# Patient Record
Sex: Female | Born: 1965 | Race: White | Hispanic: No | Marital: Married | Smoking: Never smoker
Health system: Southern US, Community
[De-identification: ages and names within clinical notes are randomized; demographics above are authoritative.]

---

## 1995-08-31 HISTORY — PX: TUBAL LIGATION: SHX77

## 2011-10-08 DIAGNOSIS — D219 Benign neoplasm of connective and other soft tissue, unspecified: Secondary | ICD-10-CM | POA: Insufficient documentation

## 2013-06-12 HISTORY — PX: ABDOMINAL HYSTERECTOMY: SHX81

## 2013-12-18 ENCOUNTER — Ambulatory Visit (INDEPENDENT_AMBULATORY_CARE_PROVIDER_SITE_OTHER): Payer: PRIVATE HEALTH INSURANCE | Admitting: Family Medicine

## 2013-12-18 ENCOUNTER — Encounter: Payer: Self-pay | Admitting: Family Medicine

## 2013-12-18 VITALS — BP 124/85 | HR 74 | Temp 98.6°F | Ht 60.0 in | Wt 112.0 lb

## 2013-12-18 DIAGNOSIS — Z131 Encounter for screening for diabetes mellitus: Secondary | ICD-10-CM

## 2013-12-18 DIAGNOSIS — B9689 Other specified bacterial agents as the cause of diseases classified elsewhere: Secondary | ICD-10-CM

## 2013-12-18 DIAGNOSIS — J329 Chronic sinusitis, unspecified: Secondary | ICD-10-CM

## 2013-12-18 DIAGNOSIS — A499 Bacterial infection, unspecified: Secondary | ICD-10-CM

## 2013-12-18 DIAGNOSIS — Z13220 Encounter for screening for lipoid disorders: Secondary | ICD-10-CM

## 2013-12-18 DIAGNOSIS — M19049 Primary osteoarthritis, unspecified hand: Secondary | ICD-10-CM

## 2013-12-18 MED ORDER — AMOXICILLIN-POT CLAVULANATE 500-125 MG PO TABS: ORAL_TABLET | ORAL | Status: DC

## 2013-12-18 NOTE — Progress Notes (Signed)
CC: Tracy Payne is a 48 y.o. 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 She has tried ibuprofen without much benefit no other interventions. Symptoms are worse in the evening when lying down.  Patient complains of bilateral hand pain that comes and goes on a daily basis mild in severity seems to be worse in cold weather or at the end of the day if she's been typing or writing throughout the day. She localizes pain to the base of the thumbs and the base of the first digit bilaterally. She does not take any medications for the pain. She denies swelling redness or warmth or anything involved locations. She has a family history of osteoarthritis but no known history of rheumatoid or any other autoimmune arthritis conditions. Denies motor or sensory disturbances in either hand or upper extremity  Review of Systems - General ROS: negative for - chills, fever, night sweats, weight gain or weight loss Ophthalmic ROS: negative for - decreased vision Psychological ROS: negative for - anxiety or depression ENT ROS: negative for - hearing change,  tinnitus or allergies Hematological and Lymphatic ROS: negative for - bleeding problems, bruising or swollen lymph nodes Breast ROS: negative Respiratory ROS: no  shortness of breath, or wheezing Cardiovascular ROS: no chest pain or dyspnea on exertion Gastrointestinal ROS: no abdominal pain, change in bowel habits, or black or bloody stools Genito-Urinary ROS: negative for - genital discharge, genital ulcers, incontinence or abnormal bleeding from genitals Musculoskeletal ROS: negative for - joint pain or muscle pain other  than that described above Neurological ROS: negative for - headaches or memory loss Dermatological ROS: negative for lumps, mole changes, rash and skin lesion changes  History reviewed. No pertinent past medical history.   Family History  Problem Relation Age of Onset  . Cancer      maternal grandfather,paternal granmother  . Heart attack Paternal Grandfather   . Hyperlipidemia Mother   . Hypertension Mother   . Stroke Maternal Grandmother      History  Substance Use Topics  . Smoking status: Never Smoker   . Smokeless tobacco: Not on file  . Alcohol Use: No     Objective: Filed Vitals:   12/18/13 1518  BP: 124/85  Pulse: 74  Temp: 98.6 F (37 C)    General: Alert and Oriented, No Acute Distress HEENT: Pupils equal, round, reactive to light. Conjunctivae clear.  External ears unremarkable, canals clear with intact TMs with appropriate landmarks.  Middle ear appears open without effusion. Erythematous inferior turbinates with mild mucoid discharge.  Moist mucous membranes, pharynx without inflammation nor lesions.  Neck supple without palpable lymphadenopathy nor abnormal masses. Lungs: Clear to auscultation bilaterally, no wheezing/ronchi/rales.  Comfortable work of breathing. Good air movement. Cardiac: Regular rate and rhythm. Normal S1/S2.  No murmurs, rubs, nor gallops.   Extremities: No peripheral edema.  Strong peripheral pulses. Full range of motion strength in bilateral hands Finkelstein's is negative bilaterally no pain in anatomical snuff box bilaterally. There is no swelling redness or warmth of any of the joints in either hand. Mental Status: No depression, anxiety, nor agitation. Skin: Warm and dry.  Assessment & Plan: [name] was seen today  for establish care and sinusitis.  Diagnoses and associated orders for this visit:  Bacterial sinusitis - amoxicillin-clavulanate (AUGMENTIN) 500-125 MG per tablet; Take one by mouth every 8 hours for ten total  days.  Lipid screening - Lipid panel  Diabetes mellitus screening - BASIC METABOLIC PANEL WITH GFR  Osteoarthritis, hand    Bacterial sinusitis: Wednesday will be the seventh day of her symptoms it symptoms persist at that point start Augmentin for now consider Alka-Seltzer cold and sinus and nasal saline washes as needed Osteoarthritis of the hands: Encouraged her to consider starting meloxicam however she believes that taking a medication would be more annoying than dealing with her current level of symptoms, I've encouraged her to return threshold is crossed when she wants to start medication Due for routine dyslipidemia and diabetic screening  Return if symptoms worsen or fail to improve, for CPE at your convienence .

## 2013-12-22 ENCOUNTER — Telehealth: Payer: Self-pay | Admitting: *Deleted

## 2013-12-22 MED ORDER — DOXYCYCLINE HYCLATE 100 MG PO TABS: ORAL_TABLET | ORAL | Status: AC

## 2013-12-22 NOTE — Telephone Encounter (Signed)
Pt notified rx faxed.

## 2013-12-22 NOTE — Telephone Encounter (Signed)
Switch to doxycycline. Tracy Payne, Rx placed in in-box ready for pickup/faxing.

## 2013-12-22 NOTE — Telephone Encounter (Signed)
Pt states the augmentin is causing diarrhea and nausea.

## 2014-01-22 ENCOUNTER — Encounter: Payer: Self-pay | Admitting: Physician Assistant

## 2014-01-22 ENCOUNTER — Ambulatory Visit (INDEPENDENT_AMBULATORY_CARE_PROVIDER_SITE_OTHER): Payer: PRIVATE HEALTH INSURANCE | Admitting: Physician Assistant

## 2014-01-22 VITALS — BP 120/73 | HR 80 | Temp 97.8°F | Wt 111.0 lb

## 2014-01-22 DIAGNOSIS — Z131 Encounter for screening for diabetes mellitus: Secondary | ICD-10-CM

## 2014-01-22 DIAGNOSIS — A499 Bacterial infection, unspecified: Secondary | ICD-10-CM

## 2014-01-22 DIAGNOSIS — J329 Chronic sinusitis, unspecified: Secondary | ICD-10-CM

## 2014-01-22 DIAGNOSIS — B9689 Other specified bacterial agents as the cause of diseases classified elsewhere: Secondary | ICD-10-CM

## 2014-01-22 DIAGNOSIS — R51 Headache: Secondary | ICD-10-CM

## 2014-01-22 DIAGNOSIS — H938X9 Other specified disorders of ear, unspecified ear: Secondary | ICD-10-CM

## 2014-01-22 DIAGNOSIS — Z13220 Encounter for screening for lipoid disorders: Secondary | ICD-10-CM

## 2014-01-22 DIAGNOSIS — R519 Headache, unspecified: Secondary | ICD-10-CM

## 2014-01-22 MED ORDER — METHYLPREDNISOLONE (PAK) 4 MG PO TABS: 4.0000 mg | ORAL_TABLET | Freq: Every day | ORAL | Status: DC

## 2014-01-22 NOTE — Progress Notes (Signed)
   Subjective:    Patient ID: Tracy Payne, female    DOB: 01/13/1966, 48 y.o.   MRN: [id number]  HPI Patient is a 48 year old female who presents to the clinic with sinus pressure and pain that has been ongoing since the last week of January. She saw Dr. Barbaraann Barthel and was treated with Augmentin and then with doxycycline. both antibiotics cause a lot of diarrhea. She stopped them both before completing more than 3 days of treatment. She continues to have a lot of sinus pressure and even jaw pain along the left side. Today her throat feels constantly irritated in her ear so very congested. She denies any cough, wheezing or shortness of breath. Yesterday she did break Augmentin and half and took twice a day and has been able to tolerate that. She denies any fever, chills, nausea or body aches.   Review of Systems     Objective:   Physical Exam  Constitutional: She is oriented to person, place, and time. She appears well-developed and well-nourished.  HENT:  Head: Normocephalic and atraumatic.  Right Ear: External ear normal.  Left Ear: External ear normal.  Nose: Nose normal.  Mouth/Throat: Oropharynx is clear and moist.  TMs clear bilaterally  There was significant left-sided maxillary tenderness to palpation and along the jawline.  Eyes: Conjunctivae are normal. Right eye exhibits no discharge. Left eye exhibits no discharge.  Neck: Normal range of motion. Neck supple.  Cardiovascular: Normal rate, regular rhythm and normal heart sounds.   Pulmonary/Chest: Effort normal and breath sounds normal.  Lymphadenopathy:    She has no cervical adenopathy.  Neurological: She is alert and oriented to person, place, and time.  Skin: Skin is dry.  Psychiatric: She has a normal mood and affect. Her behavior is normal.          Assessment & Plan:  Sinusitis- continue until all augmentin is gone Breaking into  half twice a day. Added Medrol Dosepak to help with residual inflammation. I think  patient would benefit from Zyrtec D. daily. Patient was also recommended to start probiotic daily especially while on antibiotics. Call if not improving consider CT of sinuses. I also think patient would benefit from Flonase daily.  Patient need for fasting labs lab slip was given today.

## 2014-01-22 NOTE — Patient Instructions (Addendum)
augmentin continue to take 1/2 tab until completely finish dose.  Flonase for next couple of days.  Medrol dose pak.  Probiotic when on abx.

## 2014-01-23 LAB — COMPLETE METABOLIC PANEL WITH GFR
ALBUMIN: 4.5 g/dL (ref 3.5–5.2)
ALT: 28 U/L (ref 0–35)
AST: 27 U/L (ref 0–37)
Alkaline Phosphatase: 109 U/L (ref 39–117)
BUN: 9 mg/dL (ref 6–23)
CALCIUM: 9.4 mg/dL (ref 8.4–10.5)
CHLORIDE: 101 meq/L (ref 96–112)
CO2: 26 meq/L (ref 19–32)
Creat: 0.76 mg/dL (ref 0.50–1.10)
GFR, Est Non African American: >89 mL/min
Glucose, Bld: 60 mg/dL — ABNORMAL LOW (ref 70–99)
POTASSIUM: 4.2 meq/L (ref 3.5–5.3)
Sodium: 140 mEq/L (ref 135–145)
Total Bilirubin: 0.4 mg/dL (ref 0.2–1.2)
Total Protein: 7.1 g/dL (ref 6.0–8.3)

## 2014-01-23 LAB — LIPID PANEL
Cholesterol: 214 mg/dL — ABNORMAL HIGH (ref 0–200)
HDL: 64 mg/dL (ref >39)
LDL CALC: 107 mg/dL — AB (ref 0–99)
Total CHOL/HDL Ratio: 3.3 Ratio
Triglycerides: 217 mg/dL — ABNORMAL HIGH (ref <150)
VLDL: 43 mg/dL — AB (ref 0–40)

## 2014-05-22 ENCOUNTER — Ambulatory Visit (INDEPENDENT_AMBULATORY_CARE_PROVIDER_SITE_OTHER): Payer: PRIVATE HEALTH INSURANCE | Admitting: Family Medicine

## 2014-05-22 ENCOUNTER — Encounter: Payer: Self-pay | Admitting: Family Medicine

## 2014-05-22 VITALS — BP 122/71 | HR 78 | Ht 60.0 in | Wt 112.0 lb

## 2014-05-22 DIAGNOSIS — F430 Acute stress reaction: Secondary | ICD-10-CM

## 2014-05-22 DIAGNOSIS — K5900 Constipation, unspecified: Secondary | ICD-10-CM

## 2014-05-22 DIAGNOSIS — R0789 Other chest pain: Secondary | ICD-10-CM

## 2014-05-22 LAB — COMPLETE METABOLIC PANEL WITH GFR
ALBUMIN: 4.7 g/dL (ref 3.5–5.2)
ALT: 28 U/L (ref 0–35)
AST: 23 U/L (ref 0–37)
Alkaline Phosphatase: 112 U/L (ref 39–117)
BUN: 10 mg/dL (ref 6–23)
CHLORIDE: 102 meq/L (ref 96–112)
CO2: 30 mEq/L (ref 19–32)
Calcium: 10 mg/dL (ref 8.4–10.5)
Creat: 0.89 mg/dL (ref 0.50–1.10)
GFR, Est African American: 87 mL/min
GFR, Est Non African American: 76 mL/min
Glucose, Bld: 89 mg/dL (ref 70–99)
POTASSIUM: 4.7 meq/L (ref 3.5–5.3)
SODIUM: 139 meq/L (ref 135–145)
TOTAL PROTEIN: 7 g/dL (ref 6.0–8.3)
Total Bilirubin: 0.5 mg/dL (ref 0.2–1.2)

## 2014-05-22 LAB — CK: Total CK: 57 U/L (ref 7–177)

## 2014-05-22 LAB — SEDIMENTATION RATE: Sed Rate: 20 mm/hr (ref 0–22)

## 2014-05-22 LAB — CBC
HEMATOCRIT: 40.4 % (ref 36.0–46.0)
Hemoglobin: 13.8 g/dL (ref 12.0–15.0)
MCH: 31.2 pg (ref 26.0–34.0)
MCHC: 34.2 g/dL (ref 30.0–36.0)
MCV: 91.2 fL (ref 78.0–100.0)
Platelets: 245 10*3/uL (ref 150–400)
RBC: 4.43 MIL/uL (ref 3.87–5.11)
RDW: 13.5 % (ref 11.5–15.5)
WBC: 6.2 10*3/uL (ref 4.0–10.5)

## 2014-05-22 LAB — TSH: TSH: 1.562 u[IU]/mL (ref 0.350–4.500)

## 2014-05-22 NOTE — Addendum Note (Signed)
Addended by: Jamesetta So on: 05/22/2014 02:49 PM   Modules accepted: Orders

## 2014-05-22 NOTE — Progress Notes (Signed)
Subjective:    Patient ID: Tracy Payne, female    DOB: 06-25-1966, 48 y.o.   MRN: [id number]  HPI She says every time she has to deal with her ex-husband she gets really stressed. Dull substernal chest pain.  No radiation in arm s or legs.  No diaphoresis.  No HA with it.   She will start to get chest pain that oftentimes last for hours up to 4 or 5 hours. It comes and goes. No prior history of heart disease. She's not a smoker. No family history of premature heart disease.  Not sure if happens with exercise bc she doesn't exercise.  Her husband to show up at work and starting arguig over money on escrow account.    She complains of feeling nervous and on edge more than half the days. She has trouble relaxing almost every day. She feels like she is easily annoyed and irritable several days a week and feels afraid something awful might happen several days a week. She rates her symptoms as somewhat difficult.  She also complains of constipation over the last week. No fevers chills or blood in the stool or abdominal pain. She said she did take a couple of stimulant laxatives on Sunday and that did help her have a small bowel movement but she still having some low back discomfort with it. Recently her diet has been much more low fiber. She says she's been eating a lot of hamburgers and french fries. Review of Systems  BP 122/71  Pulse 78  Ht 5' (1.524 m)  Wt 112 lb (50.803 kg)  BMI 21.87 kg/m2    Allergies  Allergen Reactions  . Clavulanic Acid     diarrhea  . Codeine     drowsiness    No past medical history on file.  Past Surgical History  Procedure Laterality Date  . Tubal ligation  08/1995  . Abdominal hysterectomy  06/12/2013    History   Social History  . Marital Status: Married    Spouse Name: N/A    Number of Children: N/A  . Years of Education: N/A   Occupational History  . Not on file.   Social History Main Topics  . Smoking status: Never Smoker   . Smokeless  tobacco: Not on file  . Alcohol Use: No  . Drug Use: No  . Sexual Activity: Yes    Partners: Male   Other Topics Concern  . Not on file   Social History Narrative  . No narrative on file    Family History  Problem Relation Age of Onset  . Cancer      maternal grandfather,paternal granmother  . Heart attack Paternal Grandfather   . Hyperlipidemia Mother   . Hypertension Mother   . Stroke Maternal Grandmother   . CAD Mother     Outpatient Encounter Prescriptions as of 05/22/2014  Medication Sig  . [DISCONTINUED] estradiol (ESTRACE) 0.5 MG tablet Take 0.5 mg by mouth daily.  . [DISCONTINUED] methylPREDNIsolone (MEDROL DOSPACK) 4 MG tablet Take 1 tablet (4 mg total) by mouth daily. follow package directions          Objective:   Physical Exam  Constitutional: She is oriented to person, place, and time. She appears well-developed and well-nourished.  HENT:  Head: Normocephalic and atraumatic.  Cardiovascular: Normal rate, regular rhythm and normal heart sounds.   Pulmonary/Chest: Effort normal and breath sounds normal.  Neurological: She is alert and oriented to person, place, and time.  Skin:  Skin is warm and dry.  Psychiatric: She has a normal mood and affect. Her behavior is normal.          Assessment & Plan:  Atypical chest pain - likey secondary to stress.  As only happens when around ex- husband.  EKG- rate of 91 beats per minute, normal sinus rhythm with normal axis. No acute ST-T wave changes. We'll get some additional blood work to rule out thyroid problems and anemia et Ronney Asters. If her pain persists then consider treadmill stress test for further evaluation. She will be in contact with [place]. Work on reducing stress levels in getting adequate sleep. She has not been eating that well recently so recommend healthy diet as well.  Acute situational stress - discussed options of counseling vs medication.  She wants to think about it.  GAD- 7 score of 11. Score is  rated as moderate anxiety.  Constipation-recommend a trial of MiraLax running with twice a day and she has soft bowel movements and then as needed. Make sure increasing fiber in diet with vegetables and fruits 120 of water.

## 2014-05-23 LAB — TROPONIN I: Troponin I: <0.01 ng/mL (ref <0.06)

## 2014-09-12 ENCOUNTER — Telehealth: Payer: Self-pay | Admitting: *Deleted

## 2014-09-12 NOTE — Telephone Encounter (Signed)
Pt called and lvm stating that she is having the same issues as before with her husband harassing her which is causing her to have CP. She stated that he had contacted her son last night via txt msg to relay a message to her which in turn caused her to become stressed and cause CP that lasted 3-4 hours. She denies any SOB or radiating pain down into arm. She would like to get something that she can take whenever these episodes arise. I informed her that I would Fwd this to Dr. Madilyn Fireman for advice since she told her at her last ov to either consider counseling or medication. I also told her that Dr. Madilyn Fireman may have her to come in to see her prior to giving her the med or have her to F/U after. She voiced understanding and agreed.Tracy Payne [place]

## 2014-09-12 NOTE — Telephone Encounter (Signed)
I can offer her something she can take every day like an SSRI that is used for anxiety or refer her to counseling or can follow back up with Dr. Ileene Rubens.

## 2014-09-13 NOTE — Telephone Encounter (Signed)
Pt stated that she would not like to be on a daily med. She doesn't feel like she needs to see a counselor at this time she just would like something to get her thru the Chest tightness/pain that she experiences for those few hours after coming in contact with her estranged husband. She  stated that due to Dr. Gardiner Ramus absence at the time of her very 1st appt she was scheduled with Dr. Ileene Rubens however, she stated that Dr. Madilyn Fireman is supposed to be her pcp. I told her that I would inquire about this and make changes if warranted . Fwd to Dr. Madilyn Fireman for f/u.[name] [place]

## 2014-09-17 MED ORDER — PROPRANOLOL HCL 10 MG PO TABS: 10.0000 mg | ORAL_TABLET | Freq: Two times a day (BID) | ORAL | Status: DC | PRN

## 2014-09-17 NOTE — Telephone Encounter (Signed)
She still needs to be on a controller as her anxiety is high on a daily basis and I really feel it would help her. i can start her on citalopram of fluoxetine daily if would like.  Can try propranol when gets the chest pain. Will help with the physical symptoms

## 2014-09-19 NOTE — Telephone Encounter (Signed)
Pt informed and will call back with what she wants to do.Tracy Payne [place]

## 2014-10-11 ENCOUNTER — Encounter: Payer: Self-pay | Admitting: Family Medicine

## 2014-10-11 ENCOUNTER — Ambulatory Visit (INDEPENDENT_AMBULATORY_CARE_PROVIDER_SITE_OTHER): Payer: PRIVATE HEALTH INSURANCE | Admitting: Family Medicine

## 2014-10-11 VITALS — BP 108/70 | HR 101 | Temp 98.2°F | Ht 60.0 in | Wt 112.0 lb

## 2014-10-11 DIAGNOSIS — J069 Acute upper respiratory infection, unspecified: Secondary | ICD-10-CM

## 2014-10-11 NOTE — Patient Instructions (Signed)
Upper Respiratory Infection, Adult An upper respiratory infection (URI) is also sometimes known as the common cold. The upper respiratory tract includes the nose, sinuses, throat, trachea, and bronchi. Bronchi are the airways leading to the lungs. Most people improve within 1 week, but symptoms can last up to 2 weeks. A residual cough may last even longer.  CAUSES Many different viruses can infect the tissues lining the upper respiratory tract. The tissues become irritated and inflamed and often become very moist. Mucus production is also common. A cold is contagious. You can easily spread the virus to others by oral contact. This includes kissing, sharing a glass, coughing, or sneezing. Touching your mouth or nose and then touching a surface, which is then touched by another person, can also spread the virus. SYMPTOMS  Symptoms typically develop 1 to 3 days after you come in contact with a cold virus. Symptoms vary from person to person. They may include:  Runny nose.  Sneezing.  Nasal congestion.  Sinus irritation.  Sore throat.  Loss of voice (laryngitis).  Cough.  Fatigue.  Muscle aches.  Loss of appetite.  Headache.  Low-grade fever. DIAGNOSIS  You might diagnose your own cold based on familiar symptoms, since most people get a cold 2 to 3 times a year. Your caregiver can confirm this based on your exam. Most importantly, your caregiver can check that your symptoms are not due to another disease such as strep throat, sinusitis, pneumonia, asthma, or epiglottitis. Blood tests, throat tests, and X-rays are not necessary to diagnose a common cold, but they may sometimes be helpful in excluding other more serious diseases. Your caregiver will decide if any further tests are required. RISKS AND COMPLICATIONS  You may be at risk for a more severe case of the common cold if you smoke cigarettes, have chronic heart disease (such as heart failure) or lung disease (such as asthma), or if  you have a weakened immune system. The very young and very old are also at risk for more serious infections. Bacterial sinusitis, middle ear infections, and bacterial pneumonia can complicate the common cold. The common cold can worsen asthma and chronic obstructive pulmonary disease (COPD). Sometimes, these complications can require emergency medical care and may be life-threatening. PREVENTION  The best way to protect against getting a cold is to practice good hygiene. Avoid oral or hand contact with people with cold symptoms. Wash your hands often if contact occurs. There is no clear evidence that vitamin C, vitamin E, echinacea, or exercise reduces the chance of developing a cold. However, it is always recommended to get plenty of rest and practice good nutrition. TREATMENT  Treatment is directed at relieving symptoms. There is no cure. Antibiotics are not effective, because the infection is caused by a virus, not by bacteria. Treatment may include:  Increased fluid intake. Sports drinks offer valuable electrolytes, sugars, and fluids.  Breathing heated mist or steam (vaporizer or shower).  Eating chicken soup or other clear broths, and maintaining good nutrition.  Getting plenty of rest.  Using gargles or lozenges for comfort.  Controlling fevers with ibuprofen or acetaminophen as directed by your caregiver.  Increasing usage of your inhaler if you have asthma. Zinc gel and zinc lozenges, taken in the first 24 hours of the common cold, can shorten the duration and lessen the severity of symptoms. Pain medicines may help with fever, muscle aches, and throat pain. A variety of non-prescription medicines are available to treat congestion and runny nose. Your caregiver   can make recommendations and may suggest nasal or lung inhalers for other symptoms.  HOME CARE INSTRUCTIONS   Only take over-the-counter or prescription medicines for pain, discomfort, or fever as directed by your  caregiver.  Use a warm mist humidifier or inhale steam from a shower to increase air moisture. This may keep secretions moist and make it easier to breathe.  Drink enough water and fluids to keep your urine clear or pale yellow.  Rest as needed.  Return to work when your temperature has returned to normal or as your caregiver advises. You may need to stay home longer to avoid infecting others. You can also use a face mask and careful hand washing to prevent spread of the virus. SEEK MEDICAL CARE IF:   After the first few days, you feel you are getting worse rather than better.  You need your caregiver's advice about medicines to control symptoms.  You develop chills, worsening shortness of breath, or brown or red sputum. These may be signs of pneumonia.  You develop yellow or brown nasal discharge or pain in the face, especially when you bend forward. These may be signs of sinusitis.  You develop a fever, swollen neck glands, pain with swallowing, or white areas in the back of your throat. These may be signs of strep throat. SEEK IMMEDIATE MEDICAL CARE IF:   You have a fever.  You develop severe or persistent headache, ear pain, sinus pain, or chest pain.  You develop wheezing, a prolonged cough, cough up blood, or have a change in your usual mucus (if you have chronic lung disease).  You develop sore muscles or a stiff neck. Document Released: 05/12/2001 Document Revised: 02/08/2012 Document Reviewed: 02/21/2014 Exit[employer] Patient Information 2015 [employer]. This information is not intended to replace advice given to you by your health care provider. Make sure you discuss any questions you have with your health care provider.  

## 2014-10-11 NOTE — Progress Notes (Signed)
   Subjective:    Patient ID: Tracy Payne, female    DOB: Nov 02, 1966, 48 y.o.   MRN: [id number]  HPI 5 days of ST and cough.  Cough bc more productive yesterday.  Yellow sputum. + nasla congestin. She feels like her sinuses are burning.  No fever, chills, or sweats.  + HA. + sneezing. Low grade fever 99 at night.  Pressure in both ear. No GI sxs.  Using Advil and Zinc.    Review of Systems     Objective:   Physical Exam  Constitutional: She is oriented to person, place, and time. She appears well-developed and well-nourished.  HENT:  Head: Normocephalic and atraumatic.  Cardiovascular: Normal rate, regular rhythm and normal heart sounds.   Pulmonary/Chest: Effort normal and breath sounds normal.  Neurological: She is alert and oriented to person, place, and time.  Skin: Skin is warm and dry.  Psychiatric: She has a normal mood and affect. Her behavior is normal.          Assessment & Plan:  Viral URI - gave reassurance. Ms. Likely viral. Exam is pretty normal today. If not better by Monday or still having low-grade temps by Monday then please call us back and I will treat with an antibiotic.

## 2014-10-12 ENCOUNTER — Ambulatory Visit: Payer: PRIVATE HEALTH INSURANCE | Admitting: Sports Medicine

## 2014-12-24 ENCOUNTER — Encounter: Payer: Self-pay | Admitting: Family Medicine

## 2014-12-24 ENCOUNTER — Ambulatory Visit (INDEPENDENT_AMBULATORY_CARE_PROVIDER_SITE_OTHER): Payer: PRIVATE HEALTH INSURANCE

## 2014-12-24 ENCOUNTER — Ambulatory Visit (INDEPENDENT_AMBULATORY_CARE_PROVIDER_SITE_OTHER): Payer: PRIVATE HEALTH INSURANCE | Admitting: Family Medicine

## 2014-12-24 VITALS — BP 111/68 | HR 93 | Ht 60.0 in | Wt 114.0 lb

## 2014-12-24 DIAGNOSIS — Z0000 Encounter for general adult medical examination without abnormal findings: Secondary | ICD-10-CM

## 2014-12-24 DIAGNOSIS — M25562 Pain in left knee: Secondary | ICD-10-CM

## 2014-12-24 DIAGNOSIS — M25552 Pain in left hip: Secondary | ICD-10-CM

## 2014-12-24 DIAGNOSIS — M25559 Pain in unspecified hip: Secondary | ICD-10-CM

## 2014-12-24 DIAGNOSIS — M25551 Pain in right hip: Secondary | ICD-10-CM

## 2014-12-24 DIAGNOSIS — M25561 Pain in right knee: Secondary | ICD-10-CM

## 2014-12-24 NOTE — Progress Notes (Signed)
Patient ID: Tracy Payne, female   DOB: 1966/01/17, 49 y.o.   MRN: [id number]  Subjective:     Tracy Payne is a 49 y.o. female and is here for a comprehensive physical exam. The patient reports problems - bilat knee and hip pain. both knees will occ give out.  Says hips get stiff and occ feel like locks, worse on the left.  Put more presure on her left hip.  Using heat, advil.  Has osteoarthritits in her family.  No swelling in her joints. No old injuries or trauma.  No fam hx of autoimmune d/o.   History   Social History  . Marital Status: Married    Spouse Name: N/A    Number of Children: N/A  . Years of Education: N/A   Occupational History  . Not on file.   Social History Main Topics  . Smoking status: Never Smoker   . Smokeless tobacco: Not on file  . Alcohol Use: No  . Drug Use: No  . Sexual Activity:    Partners: Male   Other Topics Concern  . Not on file   Social History Narrative   Health Maintenance  Topic Date Due  . HIV Screening  04/09/1979  . TETANUS/TDAP  04/09/1983  . COLONOSCOPY  04/08/2014  . INFLUENZA VACCINE  06/30/2014  . MAMMOGRAM  12/15/2016    The following portions of the patient's history were reviewed and updated as appropriate: allergies, current medications, past family history, past medical history, past social history, past surgical history and problem list.  Review of Systems A comprehensive review of systems was negative.   Objective:    BP 111/68 mmHg  Pulse 93  Ht 5' (1.524 m)  Wt 114 lb (51.71 kg)  BMI 22.26 kg/m2  SpO2 96% General appearance: alert, cooperative and appears stated age Head: Normocephalic, without obvious abnormality, atraumatic Eyes: conj clear, EOMI PERLA Ears: normal TM's and external ear canals both ears Nose: Nares normal. Septum midline. Mucosa normal. No drainage or sinus tenderness. Throat: lips, mucosa, and tongue normal; teeth and gums normal Neck: no adenopathy, no carotid bruit, supple,  symmetrical, trachea midline and thyroid not enlarged, symmetric, no tenderness/mass/nodules Back: symmetric, no curvature. ROM normal. No CVA tenderness. Lungs: clear to auscultation bilaterally Heart: regular rate and rhythm, S1, S2 normal, no murmur, click, rub or gallop Abdomen: soft, non-tender; bowel sounds normal; no masses,  no organomegaly Extremities: extremities normal, atraumatic, no cyanosis or edema Pulses: 2+ and symmetric Skin: Skin color, texture, turgor normal. No rashes or lesions Lymph nodes: Cervical, supraclavicular, and axillary nodes normal. Neurologic: Alert and oriented X 3, normal strength and tone. Normal symmetric reflexes. Normal coordination and gait  knees and hips with normal range of motion. No significant pain at the hips with internal or external rotation. Knees with some slight crepitus over the patella with flexion and extension. No joint line tenderness. Strength at the hips knees and ankles. A 5 bilaterally. No apparent swelling or erythema or rash.   Assessment:    Healthy female exam.      Plan:     See After Visit Summary for Counseling Recommendations  Keep up a regular exercise program and make sure you are eating a healthy diet Try to eat 4 servings of dairy a day, or if you are lactose intolerant take a calcium with vitamin D daily.  Your vaccines are up to date.   Bilat knee pain and hip pain - pain is been going on for well  over a year. No trauma. She has had some locking and occasional giving out. This can certainly be reflective of a cartilage tear. We'll start with plain film x-rays and go from there.  Colon cancer screening - declined colonoscopy. Had stool cards done with GYN in July with her GYN.

## 2014-12-24 NOTE — Patient Instructions (Signed)
Keep up a regular exercise program and make sure you are eating a healthy diet Try to eat 4 servings of dairy a day, or if you are lactose intolerant take a calcium with vitamin D daily.  Your vaccines are up to date.   

## 2015-01-08 LAB — LIPID PANEL
Cholesterol: 210 mg/dL — ABNORMAL HIGH (ref 0–200)
HDL: 76 mg/dL (ref >39)
LDL Cholesterol: 107 mg/dL — ABNORMAL HIGH (ref 0–99)
Total CHOL/HDL Ratio: 2.8 Ratio
Triglycerides: 135 mg/dL (ref <150)
VLDL: 27 mg/dL (ref 0–40)

## 2015-01-08 LAB — COMPLETE METABOLIC PANEL WITH GFR
ALK PHOS: 150 U/L — AB (ref 39–117)
ALT: 64 U/L — ABNORMAL HIGH (ref 0–35)
AST: 30 U/L (ref 0–37)
Albumin: 4.3 g/dL (ref 3.5–5.2)
BUN: 11 mg/dL (ref 6–23)
CO2: 30 mEq/L (ref 19–32)
CREATININE: 0.82 mg/dL (ref 0.50–1.10)
Calcium: 9.4 mg/dL (ref 8.4–10.5)
Chloride: 103 mEq/L (ref 96–112)
GFR, EST NON AFRICAN AMERICAN: 84 mL/min
GFR, Est African American: >89 mL/min
GLUCOSE: 91 mg/dL (ref 70–99)
POTASSIUM: 4.4 meq/L (ref 3.5–5.3)
Sodium: 143 mEq/L (ref 135–145)
Total Bilirubin: 0.4 mg/dL (ref 0.2–1.2)
Total Protein: 6.5 g/dL (ref 6.0–8.3)

## 2015-01-21 ENCOUNTER — Other Ambulatory Visit: Payer: Self-pay | Admitting: *Deleted

## 2015-01-21 DIAGNOSIS — R748 Abnormal levels of other serum enzymes: Secondary | ICD-10-CM

## 2015-01-21 LAB — HEPATIC FUNCTION PANEL
ALBUMIN: 4.2 g/dL (ref 3.5–5.2)
ALT: 30 U/L (ref 0–35)
AST: 16 U/L (ref 0–37)
Alkaline Phosphatase: 137 U/L — ABNORMAL HIGH (ref 39–117)
Bilirubin, Direct: 0.1 mg/dL (ref 0.0–0.3)
Indirect Bilirubin: 0.3 mg/dL (ref 0.2–1.2)
TOTAL PROTEIN: 6.8 g/dL (ref 6.0–8.3)
Total Bilirubin: 0.4 mg/dL (ref 0.2–1.2)

## 2015-01-22 ENCOUNTER — Other Ambulatory Visit: Payer: Self-pay | Admitting: *Deleted

## 2015-01-22 DIAGNOSIS — R748 Abnormal levels of other serum enzymes: Secondary | ICD-10-CM

## 2015-01-25 ENCOUNTER — Encounter: Payer: Self-pay | Admitting: Family Medicine

## 2015-01-26 ENCOUNTER — Other Ambulatory Visit: Payer: Self-pay | Admitting: *Deleted

## 2015-01-26 ENCOUNTER — Telehealth: Payer: Self-pay | Admitting: *Deleted

## 2015-01-26 DIAGNOSIS — R1013 Epigastric pain: Secondary | ICD-10-CM

## 2015-01-26 DIAGNOSIS — R110 Nausea: Secondary | ICD-10-CM

## 2015-01-26 NOTE — Telephone Encounter (Signed)
Pt called and stated that she has discussed having the US done w/her husband and would like to move forward with having this done.Tracy Payne [place]

## 2015-01-30 ENCOUNTER — Ambulatory Visit (INDEPENDENT_AMBULATORY_CARE_PROVIDER_SITE_OTHER): Payer: PRIVATE HEALTH INSURANCE

## 2015-01-30 DIAGNOSIS — R1013 Epigastric pain: Secondary | ICD-10-CM

## 2015-01-30 DIAGNOSIS — R110 Nausea: Secondary | ICD-10-CM

## 2015-01-30 DIAGNOSIS — R945 Abnormal results of liver function studies: Secondary | ICD-10-CM

## 2015-06-13 ENCOUNTER — Other Ambulatory Visit: Payer: Self-pay | Admitting: Family Medicine

## 2015-06-13 MED ORDER — DIPHENHYDRAMINE HCL 25 MG PO TABS: 25.0000 mg | ORAL_TABLET | Freq: Four times a day (QID) | ORAL | Status: AC | PRN

## 2015-08-29 ENCOUNTER — Telehealth: Payer: Self-pay | Admitting: *Deleted

## 2015-08-29 NOTE — Telephone Encounter (Signed)
Pt wanted to know if she should have a referral to rheumatology. She reports that she is having pain in L knee, R shoulder, R hand all in her joints. Pt was advised that she could be seen by one of the sports med dr's. appt set for 09/02/15 w/Dr. Georgina Snell.[name] [place]

## 2015-09-02 ENCOUNTER — Ambulatory Visit (INDEPENDENT_AMBULATORY_CARE_PROVIDER_SITE_OTHER): Payer: PRIVATE HEALTH INSURANCE | Admitting: Family Medicine

## 2015-09-02 ENCOUNTER — Ambulatory Visit (INDEPENDENT_AMBULATORY_CARE_PROVIDER_SITE_OTHER): Payer: PRIVATE HEALTH INSURANCE

## 2015-09-02 ENCOUNTER — Encounter: Payer: Self-pay | Admitting: Family Medicine

## 2015-09-02 VITALS — BP 125/74 | HR 69 | Wt 116.0 lb

## 2015-09-02 DIAGNOSIS — M25512 Pain in left shoulder: Secondary | ICD-10-CM | POA: Insufficient documentation

## 2015-09-02 DIAGNOSIS — M79641 Pain in right hand: Secondary | ICD-10-CM | POA: Insufficient documentation

## 2015-09-02 DIAGNOSIS — M542 Cervicalgia: Secondary | ICD-10-CM

## 2015-09-02 DIAGNOSIS — M4322 Fusion of spine, cervical region: Secondary | ICD-10-CM | POA: Diagnosis not present

## 2015-09-02 DIAGNOSIS — M25561 Pain in right knee: Secondary | ICD-10-CM | POA: Diagnosis not present

## 2015-09-02 DIAGNOSIS — M25511 Pain in right shoulder: Secondary | ICD-10-CM

## 2015-09-02 DIAGNOSIS — M47812 Spondylosis without myelopathy or radiculopathy, cervical region: Secondary | ICD-10-CM | POA: Diagnosis not present

## 2015-09-02 DIAGNOSIS — M25562 Pain in left knee: Secondary | ICD-10-CM

## 2015-09-02 NOTE — Assessment & Plan Note (Signed)
Unclear etiology possibly patellofemoral pain versus prepatellar bursitis. X-ray pending.

## 2015-09-02 NOTE — Patient Instructions (Signed)
Thank you for coming in today. Call or go to the ER if you develop a large red swollen joint with extreme pain or oozing puss.  Get xrays before you leave.  Return in 1 week.

## 2015-09-02 NOTE — Assessment & Plan Note (Signed)
X-ray pending bilaterally. 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 Patient had some improvement following injection today.

## 2015-09-02 NOTE — Progress Notes (Signed)
Subjective:    I'm seeing this patient as a consultation for:  Dr. Madilyn Fireman  CC: Neck, bilateral shoulder, left knee, and right hand pain  HPI:  1) neck pain: Patient notes ongoing neck pain. She states that she was told that she has a spinal fusion in her cervical spine somewhere. She's had pain off and on for 20 years. She notes pain radiating to her bilateral trapezius is but not much beyond that. She denies any weakness into her hands numbness or tingling. She denies any recent neck injury.  2)  Right shoulder pain located at the superior aspect of the shoulder and is worse with reaching across and reaching up. She denies any injury. She has not had any treatment for this problem yet  3) left shoulder pain located in the lateral upper arm worse with overhead motion reaching back. No recent injury. No treatment tried yet.  4) right hand pain: Ongoing for years without injury. The pain is predominantly in her thumb MCP and into the MCP of the second digit. She denies any tingling or pain.  5) left knee pain: Ongoing for years. Patient was doing reasonably well when she fell about 6 weeks ago. Since then she's developed anterior knee pain especially worse with knee flexion. She denies any radiating pain weakness or numbness.  Past medical history, Surgical history, Family history not pertinant except as noted below, Social history, Allergies, and medications have been entered into the medical record, reviewed, and no changes needed.   Review of Systems: No headache, visual changes, nausea, vomiting, diarrhea, constipation, dizziness, abdominal pain, skin rash, fevers, chills, night sweats, weight loss, swollen lymph nodes, body aches, joint swelling, muscle aches, chest pain, shortness of breath, mood changes, visual or auditory hallucinations.   Objective:    Filed Vitals:   09/02/15 1038  BP: 125/74  Pulse: 69   General: Well Developed, well nourished, and in no acute distress.   Neuro/Psych: Alert and oriented x3, extra-ocular muscles intact, able to move all 4 extremities, sensation grossly intact. Skin: Warm and dry, no rashes noted.  Respiratory: Not using accessory muscles, speaking in full sentences, trachea midline.  Cardiovascular: Pulses palpable, no extremity edema. Abdomen: Does not appear distended. MSK:  C-spine: Nontender to midline. Decreased range of motion and extension rotation and lateral flexion. Negative Spurling's test. Right shoulder normal-appearing. Full range of motion. Tender palpation AC  joint. Negative impingement testing. Positive crossover arm compression test. Abduction strength 4+/5 Left Shoulder normal-appearing. Full range of motion. Non-tender Negative impingement testing. Negative crossover arm compression test. Abduction strength 4+/5 Left knee: Normal-appearing. Full range of motion with 1+ retropatellar crepitation. Negative McMurray's test. Stable ligaments exam. Pain with resisted extension of her extension strength is intact and normal Right hand: Normal-appearing. Mildly tender thumb MCP. Stable valgus stress and varus stress. Nontender otherwise. Normal grip strength and sensation and pulses.    Procedure: Real-time Ultrasound Guided Injection of right AC joint  Device: GE Logiq E  Images permanently stored and available for review in the ultrasound unit. Verbal informed consent obtained. Discussed risks and benefits of procedure. Warned about infection bleeding damage to structures skin hypopigmentation and fat atrophy among others. Patient expresses understanding and agreement Time-out conducted.  Noted no overlying erythema, induration, or other signs of local infection.  Skin prepped in a sterile fashion.  Local anesthesia: Topical Ethyl chloride.  With sterile technique and under real time ultrasound guidance: 40mg  Depo-Medrol and 0.5 mL of Marcaine injected easily.  Completed  without difficulty   Pain immediately resolved suggesting accurate placement of the medication.  Advised to call if fevers/chills, erythema, induration, drainage, or persistent bleeding.  Images permanently stored and available for review in the ultrasound unit.  Impression: Technically successful ultrasound guided injection.   No results found for this or any previous visit (from the past 24 hour(s)). No results found.  Impression and Recommendations:   This case required medical decision making of moderate complexity.

## 2015-09-02 NOTE — Assessment & Plan Note (Signed)
Likely related to DDD. X-ray pending. Return in one week.

## 2015-09-02 NOTE — Progress Notes (Signed)
Quick Note:  Xray of neck shows arthritis. Xray of hand shows arthritis. Otherwise xrays are normal. ______

## 2015-09-02 NOTE — Assessment & Plan Note (Signed)
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 X-ray pending.

## 2015-09-05 ENCOUNTER — Ambulatory Visit (INDEPENDENT_AMBULATORY_CARE_PROVIDER_SITE_OTHER): Payer: PRIVATE HEALTH INSURANCE | Admitting: Family Medicine

## 2015-09-05 ENCOUNTER — Encounter: Payer: Self-pay | Admitting: Family Medicine

## 2015-09-05 VITALS — BP 133/72 | HR 74 | Wt 116.0 lb

## 2015-09-05 DIAGNOSIS — M25512 Pain in left shoulder: Secondary | ICD-10-CM | POA: Diagnosis not present

## 2015-09-05 DIAGNOSIS — M25511 Pain in right shoulder: Secondary | ICD-10-CM | POA: Insufficient documentation

## 2015-09-05 LAB — CBC
HCT: 37.9 % (ref 36.0–46.0)
HEMOGLOBIN: 13.1 g/dL (ref 12.0–15.0)
MCH: 31.5 pg (ref 26.0–34.0)
MCHC: 34.6 g/dL (ref 30.0–36.0)
MCV: 91.1 fL (ref 78.0–100.0)
MPV: 11.3 fL (ref 8.6–12.4)
Platelets: 254 10*3/uL (ref 150–400)
RBC: 4.16 MIL/uL (ref 3.87–5.11)
RDW: 13.5 % (ref 11.5–15.5)
WBC: 8.3 10*3/uL (ref 4.0–10.5)

## 2015-09-05 LAB — COMPLETE METABOLIC PANEL WITH GFR
ALBUMIN: 4.4 g/dL (ref 3.6–5.1)
ALK PHOS: 129 U/L (ref 33–130)
ALT: 23 U/L (ref 6–29)
AST: 18 U/L (ref 10–35)
BUN: 13 mg/dL (ref 7–25)
CALCIUM: 9.3 mg/dL (ref 8.6–10.4)
CO2: 29 mmol/L (ref 20–31)
Chloride: 103 mmol/L (ref 98–110)
Creat: 0.8 mg/dL (ref 0.50–1.05)
GFR, EST NON AFRICAN AMERICAN: 86 mL/min (ref >=60)
GFR, Est African American: >89 mL/min (ref >=60)
Glucose, Bld: 75 mg/dL (ref 65–99)
POTASSIUM: 4.4 mmol/L (ref 3.5–5.3)
SODIUM: 141 mmol/L (ref 135–146)
Total Bilirubin: 0.5 mg/dL (ref 0.2–1.2)
Total Protein: 6.9 g/dL (ref 6.1–8.1)

## 2015-09-05 LAB — C-REACTIVE PROTEIN: CRP: <0.5 mg/dL (ref <0.60)

## 2015-09-05 NOTE — Patient Instructions (Addendum)
Thank you for coming in today. Get labs today.  I should have results tomorrow.  Continue up to 2 aleve twice daily for pain.  My personal cell phone number is [phone] We will try physical therapy.   Return in 2-3 weeks if not better.   Septic Arthritis Septic arthritis is inflammation of a joint that results from an infection. The infection is caused by germs (bacteria) that enter the joint. Septic arthritis often starts with a painful joint that suddenly gets hot and red. The knee and hip joints are most often affected, but other joints may become infected. Usually, just one joint is infected. CAUSES  Often, septic arthritis is caused by Staphylococcus bacteria. Other bacteria that may lead to the condition include those that cause:   Fungal infections.  Sexually transmitted infections (STIs).  Tuberculosis. Infection can spread to your joint directly if you:   Have an infection somewhere else in your body that is carried to the joint by your blood. This is the most common cause of septic arthritis.  Have an open wound near a joint.  Have a needle put into your joint.  Have joint surgery. RISK FACTORS You may have a higher risk for septic arthritis if you:   Have an artificial joint.  Have a blood infection.  Had a recent joint surgery or procedure.  Had a recent joint injury.  Have diabetes.  Have arthritis.  Have a condition that weakens your body's defense system (immune system).  Use intravenous (IV) drugs.  Have gonorrhea.  SIGNS AND SYMPTOMS Signs and symptoms of septic arthritis may include:   Swelling at the joint.  Severe pain in the joint.  Redness and warmth in the joint.  Being unable to move the joint.  Fever and chills.  DIAGNOSIS  Your health care provider may do a physical exam. You may also have tests to confirm the diagnosis. These may include:  Blood tests.  Fluid removal from your joint to look for signs of infection  (synovial fluid analysis).  X-ray. TREATMENT  You may need to have fluid drained from the joint every day for several days to relieve pain. You may also start treatment with antibiotic medicine. The antibiotics may be given by IV or mouth.  HOME CARE INSTRUCTIONS   If you were prescribed an antibiotic medicine, finish it all even if you start to feel better.  Take medicines only as directed by your health care provider.  Use a cool compress on your joint to relieve pain.  Rest your joint until the pain and swelling go away.  Elevate the affected joint when resting.  After the pain and swelling are gone, do light exercises as directed by your health care provider.  Keep all follow-up visits as directed by your health care provider. This is important. SEEK MEDICAL CARE IF:  You have pain that is not controlled with medicine.  You have a fever. SEEK IMMEDIATE MEDICAL CARE IF: You have signs of worsening infection in your joint. Watch for:  Very bad pain.  Redness.  Warmth.  Swelling. MAKE SURE YOU:  Understand these instructions.  Will watch your condition.  Will get help right away if you are not doing well or get worse.   This information is not intended to replace advice given to you by your health care provider. Make sure you discuss any questions you have with your health care provider.   Document Released: 02/06/2003 Document Revised: 12/07/2014 Document Reviewed: 01/19/2014 Elsevier Interactive Patient  Education 2016 [employer].

## 2015-09-05 NOTE — Assessment & Plan Note (Signed)
Likely steroid flare. Infection is very doubtful. However will obtain labs to rule out infection including sedimentation rate, CRP, CBC and CMP. I think a significant amount of pain is due to trap spasm. Refer to physical therapy. Recheck in a few weeks. Continue ibuprofen. Patient declined opiates.

## 2015-09-05 NOTE — Progress Notes (Signed)
Tracy Payne is a 49 y.o. female who presents to [facility]  today for follow-up right shoulder pain. Patient was seen earlier this week for right shoulder pain thought to be due to the right before meals joint. She received ultrasound-guided before meals joint injection with steroids and Marcaine. She notes that she did not have any resolution of pain medially following injection and actually had worsening pain several hours after the injection. The pain has continued. She notes anterior and lateral upper arm pain and pain in her right trapezius. She denies any weakness or numbness fevers chills nausea vomiting or diarrhea. She takes ibuprofen which helped.   No past medical history on file. Past Surgical History  Procedure Laterality Date  . Tubal ligation  08/1995  . Abdominal hysterectomy  06/12/2013   Social History  Substance Use Topics  . Smoking status: Never Smoker   . Smokeless tobacco: Not on file  . Alcohol Use: No   family history includes CAD in her mother; Cancer in an other family member; Heart attack in her paternal grandfather; Hyperlipidemia in her mother; Hypertension in her mother; Stroke in her maternal grandmother.  ROS as above Medications: Current Outpatient Prescriptions  Medication Sig Dispense Refill  . diphenhydrAMINE (BENADRYL) 25 MG tablet Take 1 tablet (25 mg total) by mouth every 6 (six) hours as needed. 30 tablet PRN   No current facility-administered medications for this visit.   Allergies  Allergen Reactions  . Clavulanic Acid     diarrhea  . Codeine     drowsiness     Exam:  BP 133/72 mmHg  Pulse 74  Wt 116 lb (52.617 kg) Gen: Well NAD HEENT: EOMI,  MMM Lungs: Normal work of breathing. CTABL Heart: RRR no MRG Abd: NABS, Soft. Nondistended, Nontender Exts: Brisk capillary refill, warm and well perfused.  Right shoulder is well-appearing with no skin erythema or induration or warmth. The before meals  joint is mildly tender. Shoulder motion is limited by pain and abduction to about 110 actively and normal passively. Strength is intact. She is tender palpation of the right trapezius and right rhomboid. She has normal neck range of motion.  No results found for this or any previous visit (from the past 24 hour(s)). No results found.   Please see individual assessment and plan sections.

## 2015-09-06 ENCOUNTER — Ambulatory Visit: Payer: PRIVATE HEALTH INSURANCE | Admitting: Rehabilitative and Restorative Service Providers"

## 2015-09-06 LAB — SEDIMENTATION RATE: Sed Rate: 22 mm/hr (ref 0–30)

## 2015-09-06 NOTE — Progress Notes (Signed)
Quick Note:  Normal, no changes. ______ 

## 2015-09-10 ENCOUNTER — Ambulatory Visit: Payer: PRIVATE HEALTH INSURANCE | Admitting: Family Medicine

## 2015-09-26 ENCOUNTER — Ambulatory Visit: Payer: PRIVATE HEALTH INSURANCE | Admitting: Family Medicine

## 2015-12-16 LAB — HM MAMMOGRAPHY

## 2015-12-26 ENCOUNTER — Encounter: Payer: Self-pay | Admitting: Family Medicine

## 2016-01-20 ENCOUNTER — Telehealth: Payer: Self-pay

## 2016-01-20 NOTE — Telephone Encounter (Signed)
I agree. Continue Zyrtec 10 mg. Make sure to drink plenty of water and fluids. If at any point she feels like her throat is getting tight closing up and she needs to go to the emergency department immediately. Avoid any direct contact with the plug Ganz as far as disposing of them.

## 2016-01-20 NOTE — Telephone Encounter (Signed)
Patient advised of recommendations.  

## 2016-01-20 NOTE — Telephone Encounter (Signed)
FYI  Tracy Payne states she had a reaction to cinnamon plug ins. She has watery eyes and burning in her throat. I advised her she could try an OTC allergy medication to help relieve the symptoms.

## 2016-01-23 ENCOUNTER — Ambulatory Visit: Payer: PRIVATE HEALTH INSURANCE | Admitting: Family Medicine

## 2016-08-28 ENCOUNTER — Encounter: Payer: Self-pay | Admitting: Family Medicine

## 2016-08-31 NOTE — Telephone Encounter (Signed)
Pt scheduled for appt with PCP.

## 2016-09-01 ENCOUNTER — Ambulatory Visit (INDEPENDENT_AMBULATORY_CARE_PROVIDER_SITE_OTHER): Payer: PRIVATE HEALTH INSURANCE | Admitting: Family Medicine

## 2016-09-01 ENCOUNTER — Encounter: Payer: Self-pay | Admitting: Family Medicine

## 2016-09-01 VITALS — BP 119/67 | HR 83 | Wt 118.0 lb

## 2016-09-01 DIAGNOSIS — J069 Acute upper respiratory infection, unspecified: Secondary | ICD-10-CM | POA: Diagnosis not present

## 2016-09-01 DIAGNOSIS — G4709 Other insomnia: Secondary | ICD-10-CM | POA: Diagnosis not present

## 2016-09-01 DIAGNOSIS — R21 Rash and other nonspecific skin eruption: Secondary | ICD-10-CM

## 2016-09-01 NOTE — Progress Notes (Signed)
Subjective:    CC: [facility]   HPI:   Patient comes in today complaining of feeling sick for about 8 days. She complains of sinus congestion cough and sore throat. She said she only had one day of facial pain. When it initially started about 8 days ago she actually had vomiting and diarrhea but that only lasted a couple days and has resolved. A few days into the illness she started to notice a rash on her thighs. Since then she says a couple spots on her abdomen and one on her left facial cheek. She says they really aren't bothersome that today they felt a little itchy. She has not been treating them with anything in particular. She's not been taking any new medications such as over-the-counter cough or cold medication.  Insomnia-she reports that she's not been sleeping well for a very long time. This is not new and not related to the acute illness above. She does watch television in the bedroom. She says she has difficulty falling asleep and staying asleep. She does drink about 1/2 cups of coffee per day.  Past medical history, Surgical history, Family history not pertinant except as noted below, Social history, Allergies, and medications have been entered into the medical record, reviewed, and corrections made.   Review of Systems: No fevers, chills, night sweats, weight loss, chest pain, or shortness of breath.   Objective:    General: Well Developed, well nourished, and in no acute distress.  Neuro: Alert and oriented x3, extra-ocular muscles intact, sensation grossly intact.  HEENT: Normocephalic, atraumatic, or pharynx is clear, TMs and canals are clear bilaterally. No significant cervical lymphadenopathy.  Skin: Warm and dry, no rashes. Cardiac: Regular rate and rhythm, no murmurs rubs or gallops, no lower extremity edema.  Respiratory: Clear to auscultation bilaterally. Not using accessory muscles, speaking in full sentences.   Impression and Recommendations:    Upper respiratory  infection-likely viral seems to be improving on its own.  Rash-most consistent with viral exanthem. Seems to be improving on its own. Gave reassurance. If not resolved by next Monday then consider further workup.  Insomnia-he is really not wanting to take medication for this. She did try taking a Unisom about a week ago but says it actually made her hyper. Recommend a trial of sleep medication on YouTube. Recommend that she try it nightly for 3 weeks. Recommend avoid watching TV in the bedroom. If not improving.

## 2016-09-07 ENCOUNTER — Telehealth: Payer: Self-pay | Admitting: *Deleted

## 2016-09-07 DIAGNOSIS — R21 Rash and other nonspecific skin eruption: Secondary | ICD-10-CM

## 2016-09-07 NOTE — Telephone Encounter (Signed)
Pt called and lvm stating that she was told that if she wasn't feeling any better to call back. She reports that she is still experiencing a sore throat and the bumps will come up and go away.

## 2016-09-07 NOTE — Telephone Encounter (Signed)
Called pt back and she reports the bumps are on her arms and legs and they come up and last for about 24-48 hours and go away. She denies any fever associated with the sore throat. Will fwd to pcp for advice.Tracy Payne [place]

## 2016-09-07 NOTE — Telephone Encounter (Signed)
Pt informed to go have labs done.Tracy Payne

## 2016-09-07 NOTE — Telephone Encounter (Signed)
OK, lets get labs. Order placed.

## 2016-09-08 LAB — CBC WITH DIFFERENTIAL/PLATELET
Basophils Absolute: 0 cells/uL (ref 0–200)
Basophils Relative: 0 %
EOS PCT: 1 %
Eosinophils Absolute: 65 cells/uL (ref 15–500)
HCT: 39.9 % (ref 35.0–45.0)
Hemoglobin: 13.5 g/dL (ref 11.7–15.5)
LYMPHS PCT: 27 %
Lymphs Abs: 1755 cells/uL (ref 850–3900)
MCH: 31.3 pg (ref 27.0–33.0)
MCHC: 33.8 g/dL (ref 32.0–36.0)
MCV: 92.4 fL (ref 80.0–100.0)
MONOS PCT: 6 %
MPV: 10.1 fL (ref 7.5–12.5)
Monocytes Absolute: 390 cells/uL (ref 200–950)
Neutro Abs: 4290 cells/uL (ref 1500–7800)
Neutrophils Relative %: 66 %
PLATELETS: 262 10*3/uL (ref 140–400)
RBC: 4.32 MIL/uL (ref 3.80–5.10)
RDW: 13.4 % (ref 11.0–15.0)
WBC: 6.5 10*3/uL (ref 3.8–10.8)

## 2016-09-09 NOTE — Telephone Encounter (Signed)
All labs are normal. 

## 2016-09-12 LAB — VIRAL SCREEN(EBV CAPSID AB+RSV+CMV AB)
EBV VCA IGG: 313 U/mL — AB
RSV Antibodies: 1:16 {titer} — ABNORMAL HIGH

## 2016-09-15 ENCOUNTER — Telehealth: Payer: Self-pay | Admitting: *Deleted

## 2016-09-25 NOTE — Telephone Encounter (Signed)
Encounter opened in error.Tracy Payne [place]

## 2016-12-18 LAB — HM MAMMOGRAPHY

## 2017-01-14 ENCOUNTER — Encounter: Payer: Self-pay | Admitting: Family Medicine

## 2017-03-19 ENCOUNTER — Other Ambulatory Visit: Payer: Self-pay | Admitting: Family Medicine

## 2017-03-19 DIAGNOSIS — M25562 Pain in left knee: Secondary | ICD-10-CM

## 2017-03-19 DIAGNOSIS — R7989 Other specified abnormal findings of blood chemistry: Secondary | ICD-10-CM

## 2017-03-19 DIAGNOSIS — E538 Deficiency of other specified B group vitamins: Secondary | ICD-10-CM

## 2017-03-19 NOTE — Progress Notes (Signed)
Per Pt request, to be done at [facility]. Order will be faxed to [phone].

## 2017-03-19 NOTE — Progress Notes (Signed)
Referral placed for left knee pain to ortho per Pt request.

## 2017-03-19 NOTE — Addendum Note (Signed)
Addended by: Huel Cote on: 03/19/2017 12:04 PM   Modules accepted: Orders

## 2017-03-25 LAB — VITAMIN D 25 HYDROXY (VIT D DEFICIENCY, FRACTURES): VIT D 25 HYDROXY: 96

## 2017-03-25 LAB — VITAMIN B12: VITAMIN B 12: 1364

## 2017-03-29 ENCOUNTER — Telehealth: Payer: Self-pay | Admitting: Family Medicine

## 2017-03-29 NOTE — Telephone Encounter (Signed)
Call pt: b12  Done at [facility] is way too high. B12 that is high can cause neuropathy just like having low B12. Recommend stop any supplement. Vit D looks great.

## 2017-03-31 NOTE — Telephone Encounter (Signed)
Pt advised of results, verbalized understanding. Pt does report she was advised her Vit D was high as well.  Labs will be rechecked in august, she has stopped all supplements.

## 2017-04-06 ENCOUNTER — Encounter: Payer: Self-pay | Admitting: Family Medicine

## 2017-08-19 ENCOUNTER — Encounter: Payer: Self-pay | Admitting: *Deleted

## 2017-08-19 ENCOUNTER — Encounter: Payer: Self-pay | Admitting: Family Medicine

## 2017-08-19 ENCOUNTER — Ambulatory Visit (INDEPENDENT_AMBULATORY_CARE_PROVIDER_SITE_OTHER): Payer: PRIVATE HEALTH INSURANCE | Admitting: Family Medicine

## 2017-08-19 VITALS — BP 112/75 | HR 95 | Temp 98.4°F | Ht 60.0 in | Wt 118.0 lb

## 2017-08-19 DIAGNOSIS — H9201 Otalgia, right ear: Secondary | ICD-10-CM | POA: Diagnosis not present

## 2017-08-19 DIAGNOSIS — H8111 Benign paroxysmal vertigo, right ear: Secondary | ICD-10-CM

## 2017-08-19 MED ORDER — ONDANSETRON 4 MG PO TBDP: 4.0000 mg | ORAL_TABLET | Freq: Three times a day (TID) | ORAL | 0 refills | Status: AC | PRN

## 2017-08-19 MED ORDER — PROMETHAZINE HCL 25 MG/ML IJ SOLN
25.0000 mg | Freq: Once | INTRAMUSCULAR | Status: AC
  Administered 2017-08-19: 25 mg via INTRAMUSCULAR

## 2017-08-19 NOTE — Addendum Note (Signed)
Addended by: Teddy Spike on: 08/19/2017 11:55 AM   Modules accepted: Orders

## 2017-08-19 NOTE — Addendum Note (Signed)
Addended by: Beatrice Lecher D on: 08/19/2017 11:59 AM   Modules accepted: Orders

## 2017-08-19 NOTE — Progress Notes (Addendum)
Subjective:    Patient ID: Tracy Payne, female    DOB: [birth year], 51 y.o.   MRN: [id number]  HPI Sxs started 2 days ago with dizziness, nausea and vomiting.  Temp last night was 99.1. She has a slight HA today.   She said she woke up suddenly about 24 hours ago in the middle the night around 2 AM and felt suddenly dizzy and vomited. About a couple months ago she had some problems with the some eustachian tube dysfunction in that right ear and took Sudafed which did seem to help. about 3 days ago she started to get some recurrent pain in that ear. Today the pain is mild and she's having a little bit of sore throat on that right side. Over the last 24 hours she's had very little by mouth intake. She did try taking some over-the-counter antinausea medication. She thinks it may have helped a little but when she tried to take a second dose she couldn't keep it down. No diarrhea. Her dizziness is worse when she moves her head. It seems to settle down if she's just completely still.  Review of Systems  BP 112/75   Pulse 95   Temp 98.4 F (36.9 C)   Ht 5' (1.524 m)   Wt 118 lb (53.5 kg)   SpO2 100%   BMI 23.05 kg/m     Allergies  Allergen Reactions  . Clavulanic Acid     diarrhea  . Codeine     drowsiness    No past medical history on file.  Past Surgical History:  Procedure Laterality Date  . ABDOMINAL HYSTERECTOMY  06/12/2013  . TUBAL LIGATION  08/1995    Social History   Social History  . Marital status: Married    Spouse name: N/A  . Number of children: N/A  . Years of education: N/A   Occupational History  . Not on file.   Social History Main Topics  . Smoking status: Never Smoker  . Smokeless tobacco: Never Used  . Alcohol use No  . Drug use: No  . Sexual activity: Yes    Partners: Male   Other Topics Concern  . Not on file   Social History Narrative  . No narrative on file    Family History  Problem Relation Age of Onset  . Cancer Unknown    maternal grandfather,paternal granmother  . Heart attack Paternal Grandfather   . Hyperlipidemia Mother   . Hypertension Mother   . Stroke Maternal Grandmother   . CAD Mother     Outpatient Encounter Prescriptions as of 08/19/2017  Medication Sig  . diphenhydrAMINE (BENADRYL) 25 MG tablet Take 1 tablet (25 mg total) by mouth every 6 (six) hours as needed.  . gabapentin (NEURONTIN) 100 MG capsule Take 100 mg by mouth.  . ondansetron (ZOFRAN-ODT) 4 MG disintegrating tablet Take 1 tablet (4 mg total) by mouth every 8 (eight) hours as needed for nausea or vomiting.  . [EXPIRED] promethazine (PHENERGAN) injection 25 mg    No facility-administered encounter medications on file as of 08/19/2017.          Objective:   Physical Exam  Constitutional: She is oriented to person, place, and time. She appears well-developed and well-nourished.  HENT:  Head: Normocephalic and atraumatic.  Right Ear: External ear normal.  Left Ear: External ear normal.  Nose: Nose normal.  Mouth/Throat: Oropharynx is clear and moist.  TMs and canals are clear.   Eyes: Pupils are equal, round, and  reactive to light. Conjunctivae and EOM are normal.  Neck: Neck supple. No thyromegaly present.  Cardiovascular: Normal rate, regular rhythm and normal heart sounds.   Pulmonary/Chest: Effort normal and breath sounds normal. She has no wheezes.  Lymphadenopathy:    She has no cervical adenopathy.  Neurological: She is alert and oriented to person, place, and time.  Positive Dix-Hallpike maneuver on the right with vertical nystagmus.  Skin: Skin is warm and dry.  Psychiatric: She has a normal mood and affect.        Assessment & Plan:  Benign positional vertigo-we'll refer for physical therapy. Explained it manually take 1 or 2 treatments to improve her symptoms. In the short-term we'll give her an IM injection of Phenergan so that she can try to keep some fluids down this afternoon as well as some Zofran melts.  Declined suppository. She'll call back if she develops fever or worsening symptoms with her sore throat or ear.  Right ear pain-exam is completely normal. We'll continue to monitor. Call if symptoms worsen.

## 2017-08-19 NOTE — Patient Instructions (Signed)
Call if not better after the weekend.

## 2017-09-29 IMAGING — CR DG CERVICAL SPINE COMPLETE 4+V
6 series · 6 of 6 positions shown · non-contrast
Comparison: None.

CLINICAL DATA: Neck pain for years.  No known injury.

EXAM:
CERVICAL SPINE  4+ VIEWS

[c-spine lat]
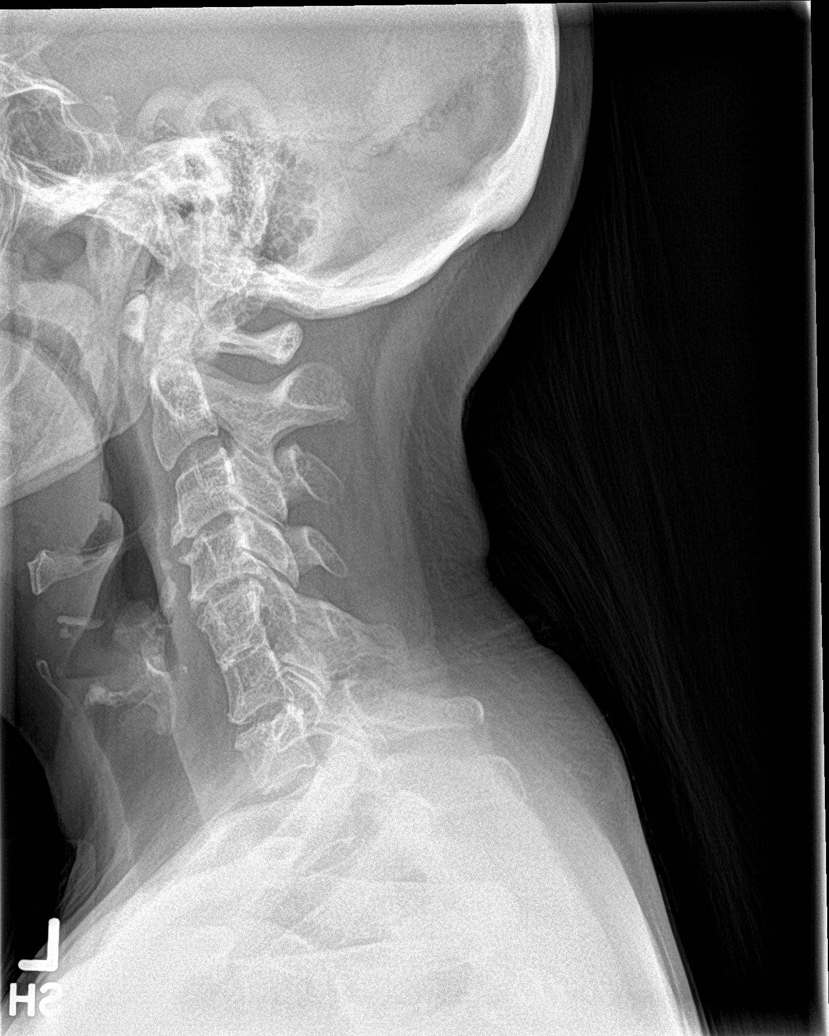

[c-spine obl (1 of 2)]
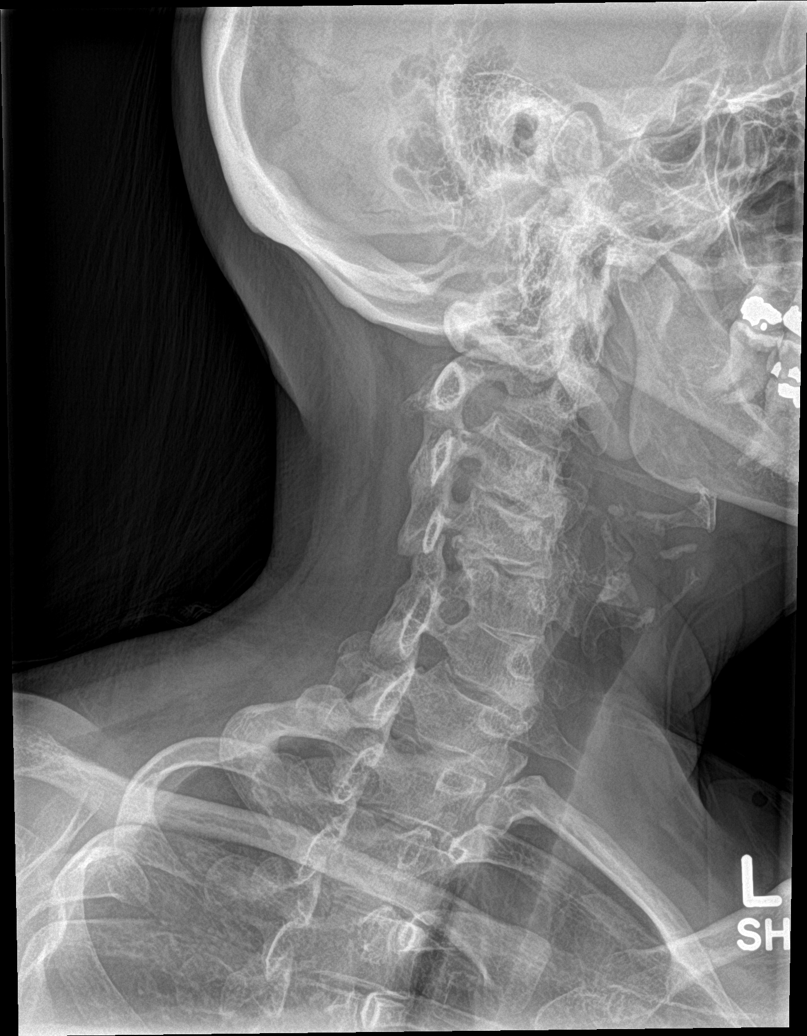

[c-spine obl (2 of 2)]
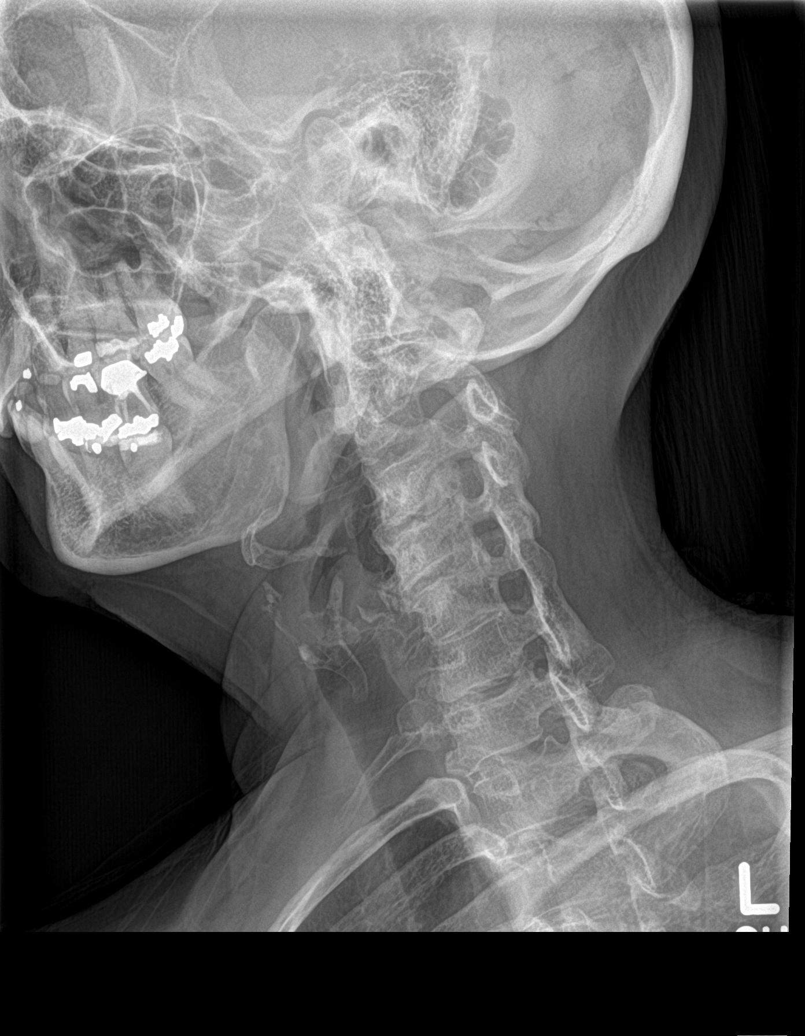

[c-spine ap]
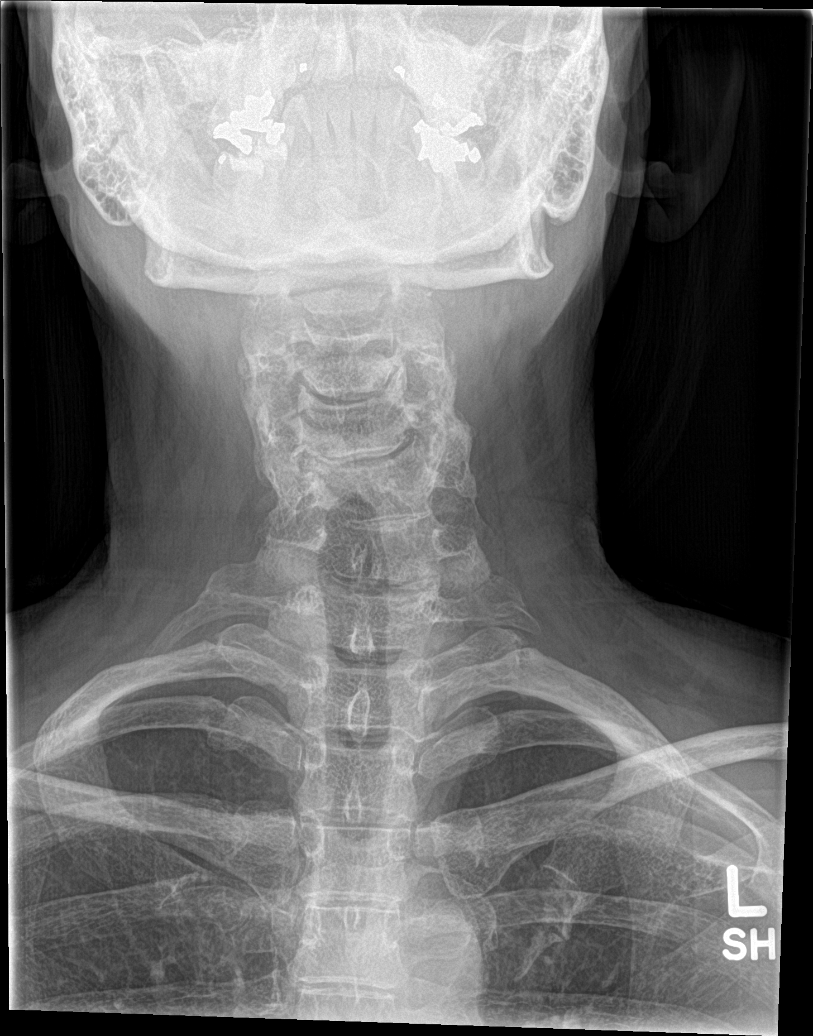

[c-spine open mouth]
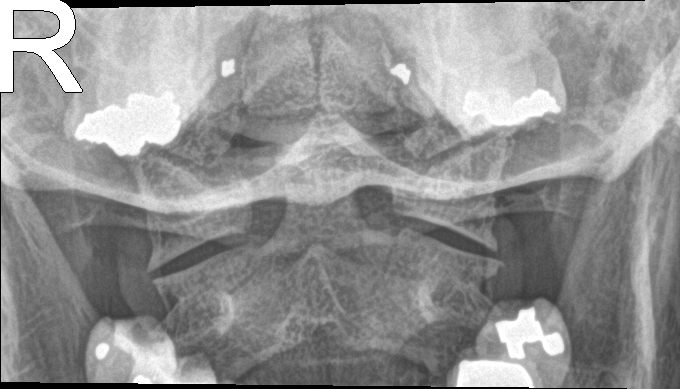

[[person_name]]
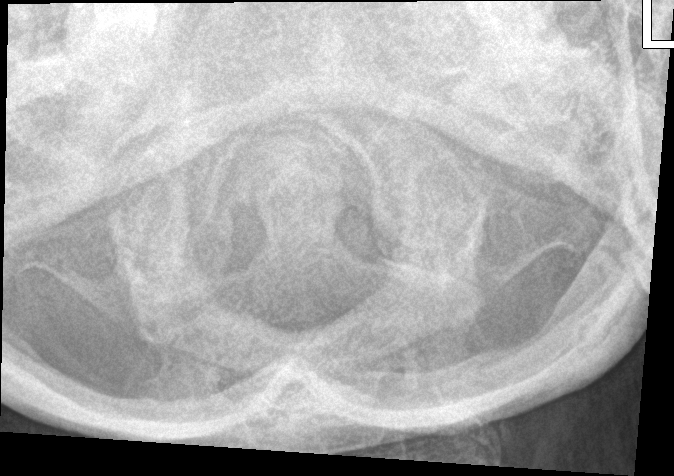

[6 of 6 positions shown; findings below may reference images not displayed]

FINDINGS: There is congenital appearing fusion at C5-6, including the
posterior elements. Degenerative disc disease at C3-4 and C4-5 with
disc space narrowing and spurring. Mild diffuse bilateral
degenerative facet disease. Normal alignment. Moderate right neural
foraminal narrowing at C4-5 due to uncovertebral spurring. No left
neural foraminal narrowing. No fracture.
IMPRESSION: Cervical spondylosis as above. Congenital fusion at C5-6. No acute
bony abnormality.

## 2017-09-29 IMAGING — CR DG HAND COMPLETE 3+V*R*
3 series · 3 of 3 positions shown · non-contrast
Comparison: None.

CLINICAL DATA: Atraumatic right thumb and index finger pain.

EXAM:
RIGHT HAND - COMPLETE 3+ VIEW

[hand pa]
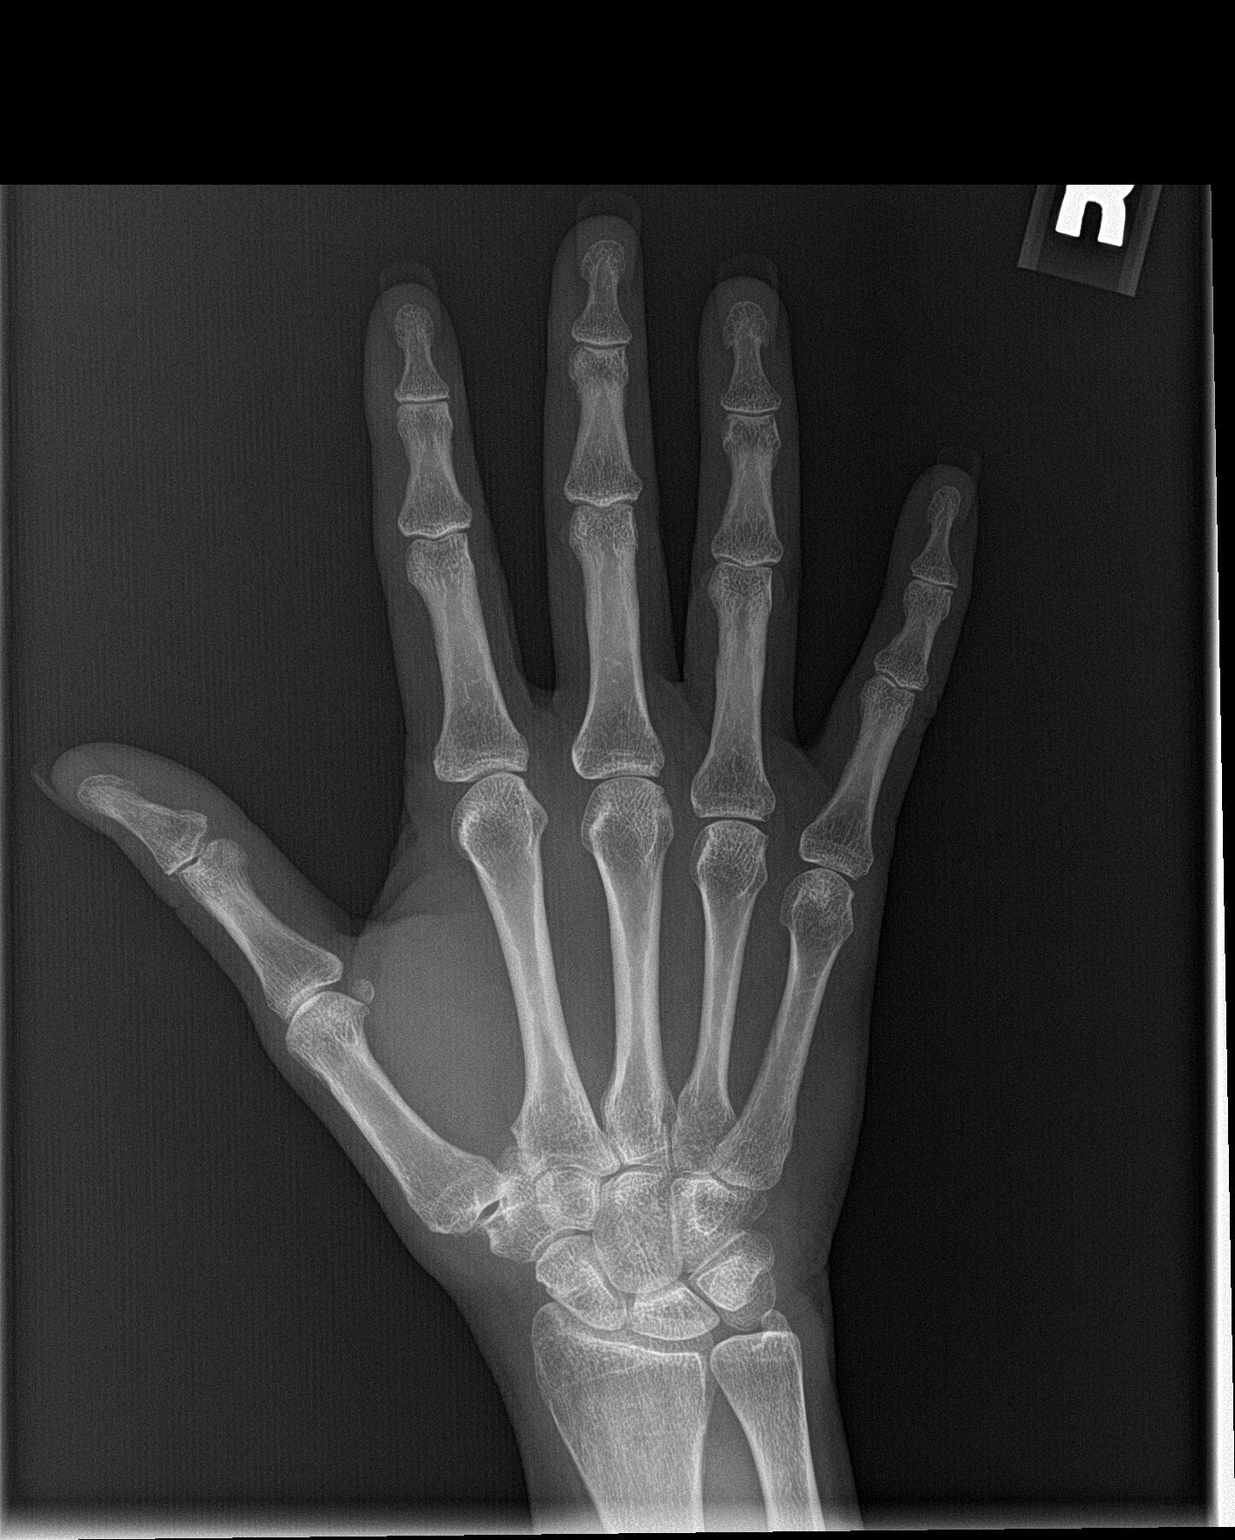

[hand obl]
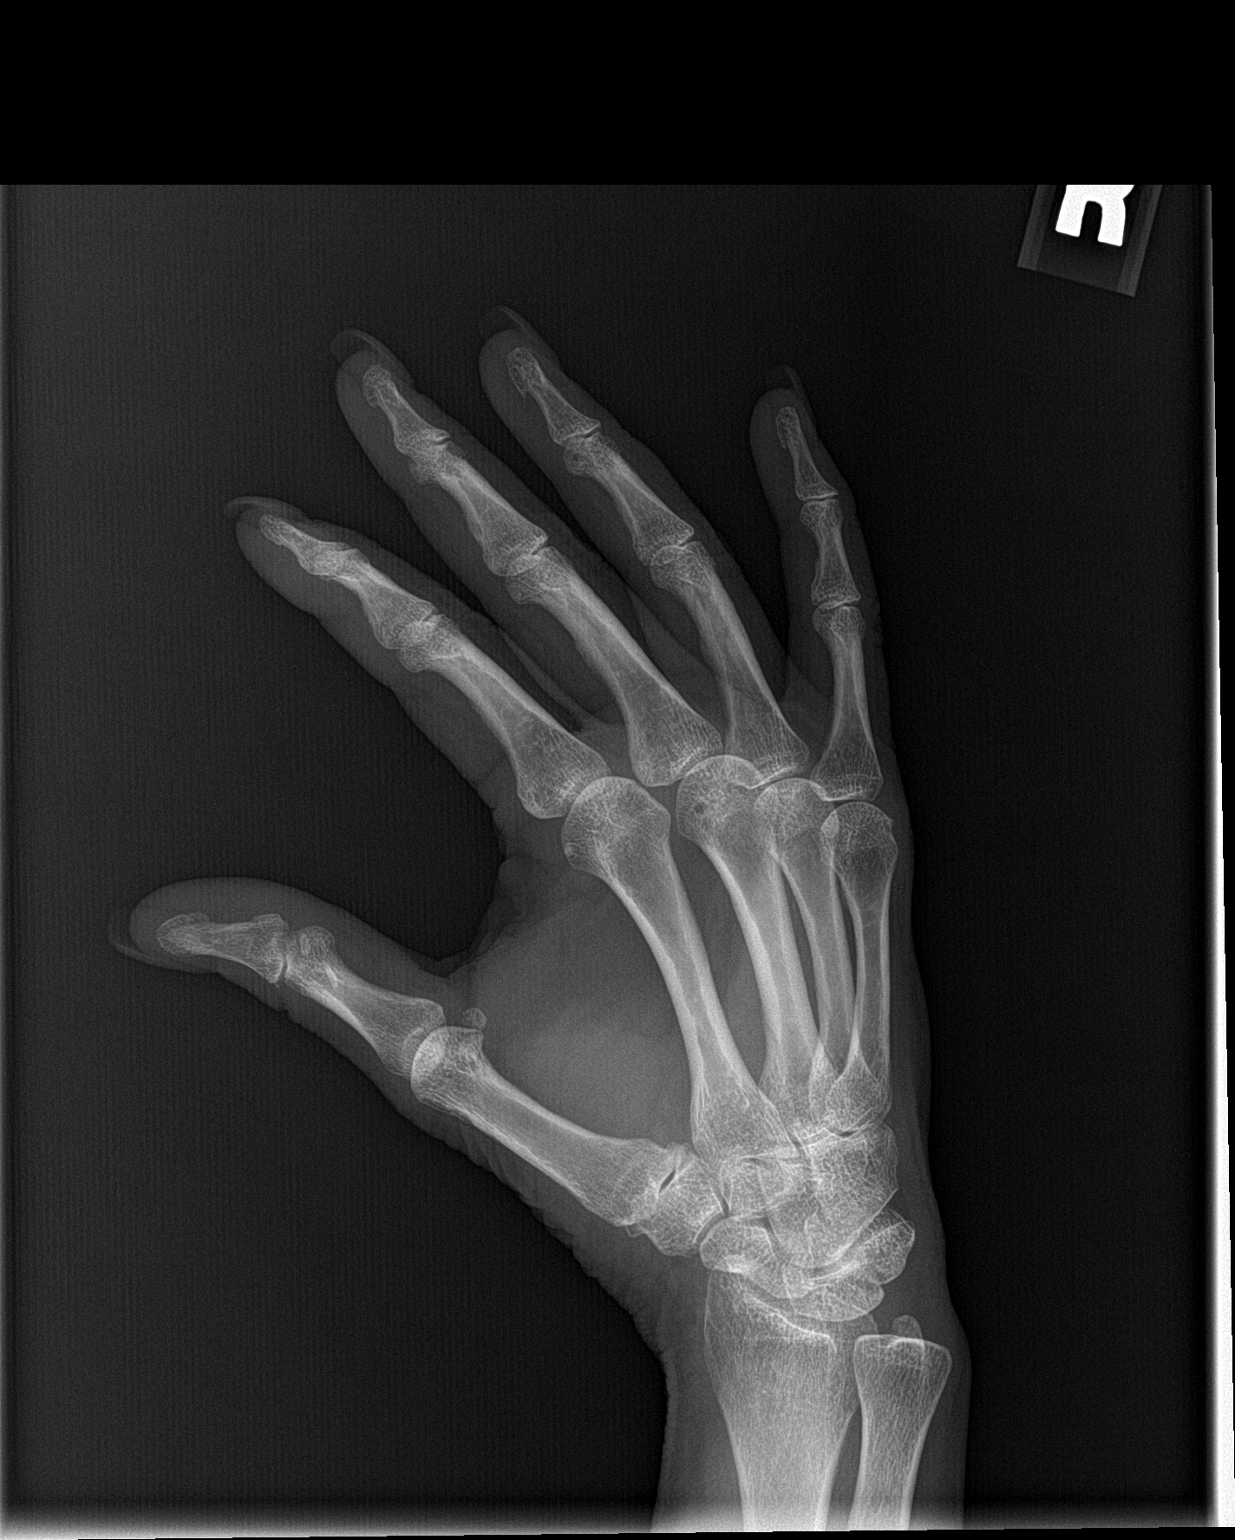

[hand lat]
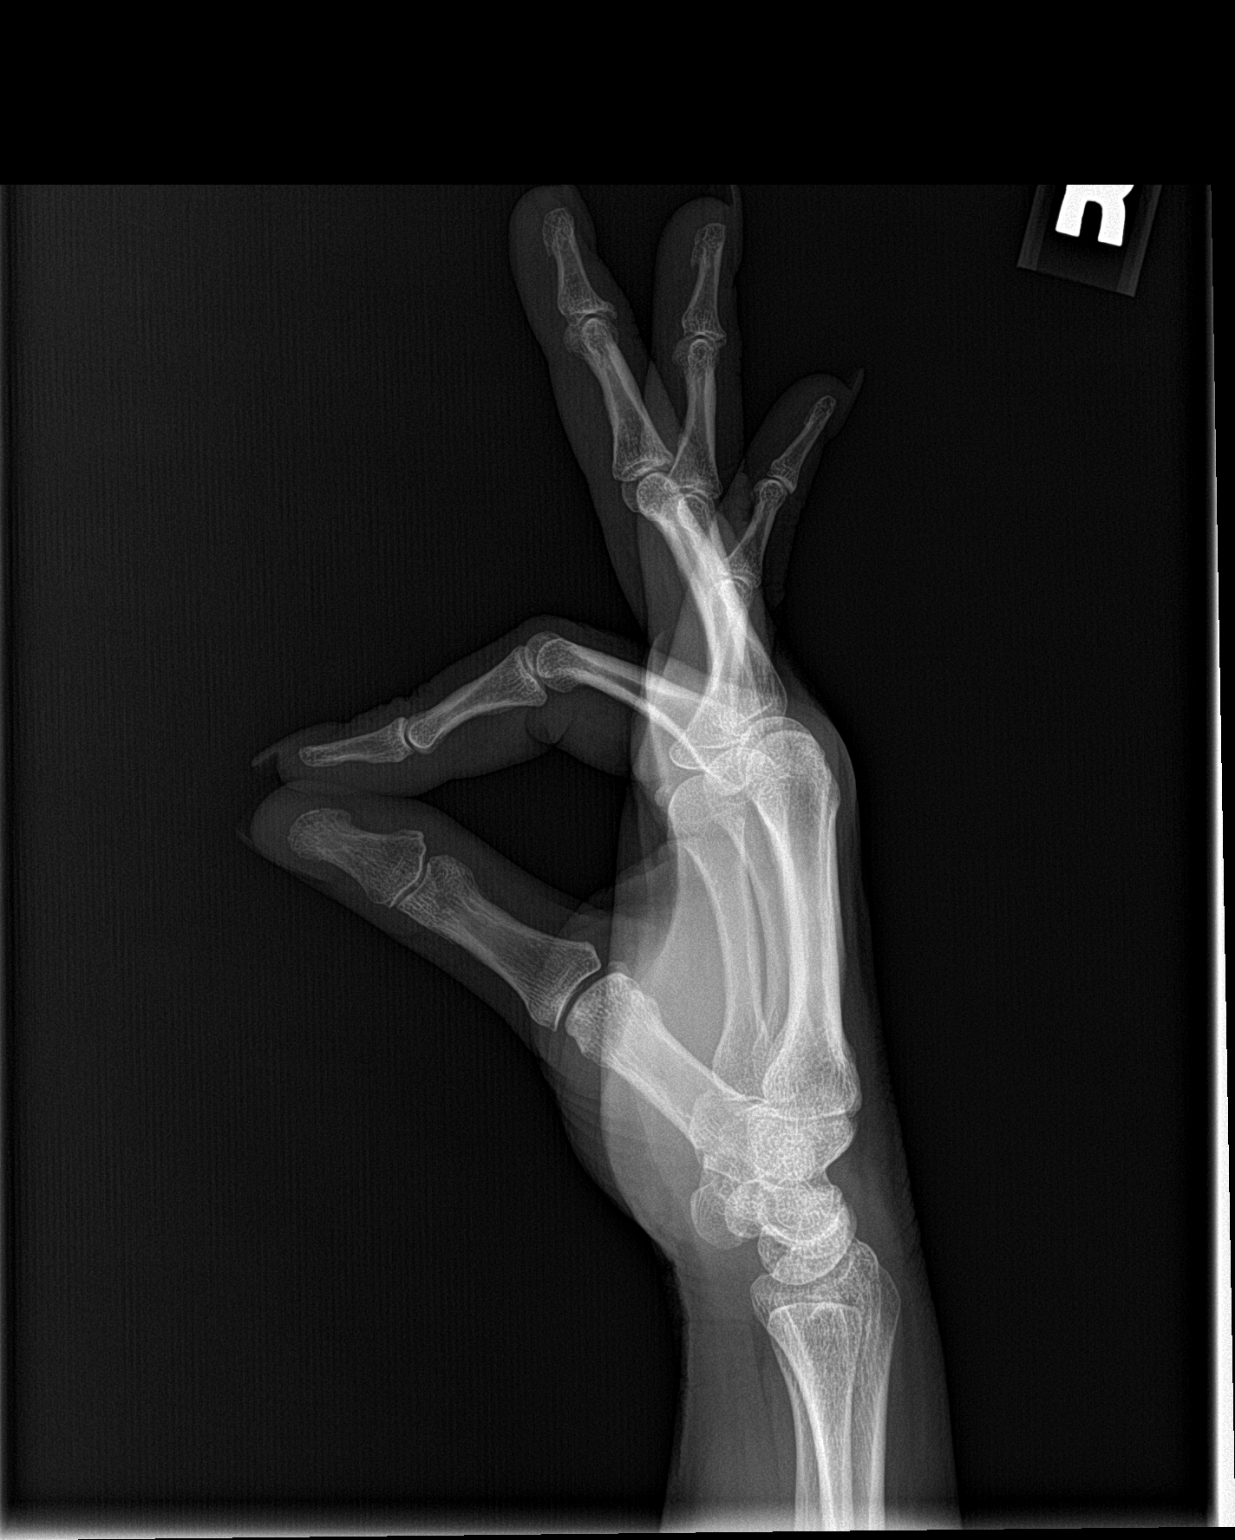

[3 of 3 positions shown; findings below may reference images not displayed]

FINDINGS: The bones of the right hand are adequately mineralized for age.
There is very mild symmetric narrowing of the interphalangeal and
metacarpophalangeal joints. There are no erosive changes. There is
no lytic or blastic lesion. There is mild degenerative change of the
first carpometacarpal joint. The soft tissues are unremarkable.
IMPRESSION: There are mild osteoarthritic changes of the right hand. The first
CMC joint appears to be the most significantly involved.
